# Patient Record
Sex: Female | Born: 1955 | Race: Black or African American | Hispanic: No | Marital: Married | Smoking: Never smoker
Health system: Southern US, Community
[De-identification: ages and names within clinical notes are randomized; demographics above are authoritative.]

## PROBLEM LIST (undated history)

## (undated) DIAGNOSIS — K5120 Ulcerative (chronic) proctitis without complications: Secondary | ICD-10-CM

## (undated) DIAGNOSIS — E7800 Pure hypercholesterolemia, unspecified: Secondary | ICD-10-CM

## (undated) HISTORY — PX: PARTIAL HYSTERECTOMY: SHX80

## (undated) HISTORY — DX: Pure hypercholesterolemia, unspecified: E78.00

## (undated) HISTORY — DX: Ulcerative (chronic) proctitis without complications: K51.20

---

## 2014-06-15 ENCOUNTER — Encounter (INDEPENDENT_AMBULATORY_CARE_PROVIDER_SITE_OTHER): Payer: Self-pay | Admitting: *Deleted

## 2014-07-20 ENCOUNTER — Ambulatory Visit (INDEPENDENT_AMBULATORY_CARE_PROVIDER_SITE_OTHER): Payer: BLUE CROSS/BLUE SHIELD | Admitting: Internal Medicine

## 2014-07-20 ENCOUNTER — Encounter (INDEPENDENT_AMBULATORY_CARE_PROVIDER_SITE_OTHER): Payer: Self-pay | Admitting: Internal Medicine

## 2014-07-20 VITALS — BP 122/62 | HR 80 | Temp 97.5°F | Ht 62.0 in | Wt 138.3 lb

## 2014-07-20 DIAGNOSIS — K5190 Ulcerative colitis, unspecified, without complications: Secondary | ICD-10-CM

## 2014-07-20 LAB — CBC WITH DIFFERENTIAL/PLATELET
Basophils Absolute: 0 10*3/uL (ref 0.0–0.1)
Basophils Relative: 0 % (ref 0–1)
EOS ABS: 0.1 10*3/uL (ref 0.0–0.7)
Eosinophils Relative: 1 % (ref 0–5)
HEMATOCRIT: 39.6 % (ref 36.0–46.0)
HEMOGLOBIN: 13.4 g/dL (ref 12.0–15.0)
LYMPHS PCT: 45 % (ref 12–46)
Lymphs Abs: 2.8 10*3/uL (ref 0.7–4.0)
MCH: 32 pg (ref 26.0–34.0)
MCHC: 33.8 g/dL (ref 30.0–36.0)
MCV: 94.5 fL (ref 78.0–100.0)
MPV: 9.7 fL (ref 8.6–12.4)
Monocytes Absolute: 0.2 10*3/uL (ref 0.1–1.0)
Monocytes Relative: 3 % (ref 3–12)
NEUTROS ABS: 3.2 10*3/uL (ref 1.7–7.7)
Neutrophils Relative %: 51 % (ref 43–77)
Platelets: 184 10*3/uL (ref 150–400)
RBC: 4.19 MIL/uL (ref 3.87–5.11)
RDW: 16.3 % — AB (ref 11.5–15.5)
WBC: 6.2 10*3/uL (ref 4.0–10.5)

## 2014-07-20 LAB — C-REACTIVE PROTEIN: CRP: 0.7 mg/dL — ABNORMAL HIGH (ref <0.60)

## 2014-07-20 MED ORDER — MESALAMINE ER 0.375 G PO CP24: 375.0000 mg | ORAL_CAPSULE | Freq: Every day | ORAL | Status: DC

## 2014-07-20 NOTE — Patient Instructions (Signed)
Am going to get records from Dr. Vangie BickerSpainhour's office. Will start UC mediation once I have those records.

## 2014-07-20 NOTE — Progress Notes (Addendum)
   Subjective:    Patient ID: Sydney Hughes, female    DOB: Sep 21, 1955, 59 y.o.   MRN: [id number]  HPI Referred to our office by [facility], [place], [place]. Hx of Ulcerative proctitis. Diagnosed with ulcerative proctitis in 2007 and apparently lost response to Mesalamine and Canasa. Per records last colonoscopy was in 2011. Per notes, she had good response to Remicade and Entyvio but they caused lupoid like reaction per records. She had joint pain.  She was taking 6MP in October  but she had a drop in her hemoglobin and the medication was stopped. Presently not on any medications for UC since November.  She denies any rectal bleeding. Has not had any rectal bleeding since 2013.NO rectal mucous. There is no abdominal pain. She usually has a BM x 1 a day.  She feels sluggish and tired. Appetite is good. No weight loss.    02/26/2010 Colonoscopy: Finding: Erosive exudative, friable distal and mid rectum and multiple biopsies taken to rule out Crohn's disease or dysplasia.. The priximal rectum sigmoid, descending,transverse, ascending colon and cecum with no abnormalities. Cecm visualized.  Biopsy: Acute and chronic inflammation compatible with inflammatory bowel disease of rectum.  12/27/2010 Flexible Sigmoidoscopy: Continued UC.   05/05/2014 H and H 10.9 and 36.4, MCV 100.5, platelet ct 213. Albumin 3.5, ALP 88, ALT 41, AST 31, Total bili 0.6, direct bili 0.1, indirect 0.5.   Review of Systems Past Medical History  Diagnosis Date  . 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  No Known Allergies  No current outpatient prescriptions on file prior to visit.   No current facility-administered medications on file prior to visit.        Objective:   Physical Exam  Filed Vitals:   07/20/14 1521  Height: 5\' 2"  (1.575 m)  Weight: 138 lb 4.8 oz (62.732 kg)  Alert and oriented. Skin warm and dry. Oral mucosa is moist.   .  Sclera anicteric, conjunctivae is pink. Thyroid not enlarged. No cervical lymphadenopathy. Lungs clear. Heart regular rate and rhythm.  Abdomen is soft. Bowel sounds are positive. No hepatomegaly. No abdominal masses felt. No tenderness.  No edema to lower extremities.           Assessment & Plan:  Presumed UC. I do not have records from [place]. I am going to request these records for a colonoscopy.(Records received 345pm today).  Am going to start her on Apriso 0375g ( 4 tabs daily).  OV in 3 months. CBC and CRP today.

## 2014-08-15 ENCOUNTER — Telehealth (INDEPENDENT_AMBULATORY_CARE_PROVIDER_SITE_OTHER): Payer: Self-pay | Admitting: *Deleted

## 2014-08-15 NOTE — Telephone Encounter (Signed)
Sydney GowerCharlotte started the [place] on 07/21/14 then on 08/12/14 she started having Nausea, sick feeling, rash on her inter thighs, now on hands. The return phone number is [phone].

## 2014-08-15 NOTE — Telephone Encounter (Signed)
Advised her to stop the Apriso. Patient has been intolerant of all medications. Get Benadryl and take for the rash on your thighs, and for itching. OV in May

## 2014-10-18 ENCOUNTER — Encounter (INDEPENDENT_AMBULATORY_CARE_PROVIDER_SITE_OTHER): Payer: Self-pay | Admitting: Internal Medicine

## 2014-10-18 ENCOUNTER — Ambulatory Visit (INDEPENDENT_AMBULATORY_CARE_PROVIDER_SITE_OTHER): Payer: BLUE CROSS/BLUE SHIELD | Admitting: Internal Medicine

## 2014-10-18 VITALS — BP 102/70 | HR 76 | Temp 98.2°F | Ht 62.0 in | Wt 136.2 lb

## 2014-10-18 DIAGNOSIS — K5120 Ulcerative (chronic) proctitis without complications: Secondary | ICD-10-CM

## 2014-10-18 NOTE — Progress Notes (Addendum)
   Subjective:    Patient ID: Sydney PearsonCharlotte Hughes, female    DOB: [birth year], 59 y.o.   MRN: [id number]  HPI Here today for f/u of her UC.  Last seen in February as initial visit. Patient of Dr. Aleene DavidsonSpainhour.  Diagnosed with ulcerative proctitis in 2007 and apparently lost response to Mesalamine and Canasa.  R  She had good response to Remicade and Entyvio she thinks in October of 2015 but they caused lupoid like reaction per records. She had joint pain with stiffness She was taking 6MP in October which did not help. She has taking Canasa which did not stop the bleeding. She has tried Uceris which did not help. Asacol did not help.   Per records last colonoscopy was in 2011. She was started on Apriso. her lat OV but developed a rash and the medication was stopped.  She tells me she is doing okay. She is having 3 BMs a day and sometimes she will skip a day. She has not seen any blood in her stools. No mucous. Appetite is good. She has lost about 1 1/12 pounds since her visit in February.  Has used Hydrocortisone enemas during a flare. No recent rashes. No joint pain now.   CBC    Component Value Date/Time   WBC 6.2 07/20/2014 1544   RBC 4.19 07/20/2014 1544   HGB 13.4 07/20/2014 1544   HCT 39.6 07/20/2014 1544   PLT 184 07/20/2014 1544   MCV 94.5 07/20/2014 1544   MCH 32.0 07/20/2014 1544   MCHC 33.8 07/20/2014 1544   RDW 16.3* 07/20/2014 1544   LYMPHSABS 2.8 07/20/2014 1544   MONOABS 0.2 07/20/2014 1544   EOSABS 0.1 07/20/2014 1544   BASOSABS 0.0 07/20/2014 1544   07/20/2014 sedrate 0.7    02/26/2010 Colonoscopy: Finding: Erosive exudative, friable distal and mid rectum and multiple biopsies taken to rule out Crohn's disease or dysplasia.. The priximal rectum sigmoid, descending,transverse, ascending colon and cecum with no abnormalities. Cecm visualized. Biopsy: Acute and chronic inflammation compatible with inflammatory bowel disease of rectum.  12/27/2010 Flexible Sigmoidoscopy:  Continued UC.   Review of Systems Past Medical History  Diagnosis Date  . 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  No Known Allergies  Current Outpatient Prescriptions  Medication Sig Dispense Refill  . Multiple Vitamin (MULTIVITAMIN) tablet Take 1 tablet by mouth daily.     No current facility-administered medications for this visit.         Objective:   Physical Exam Blood pressure 102/70, pulse 76, temperature 98.2 F (36.8 C), height 5\' 2"  (1.575 m), weight 136 lb 3.2 oz (61.78 kg).  Alert and oriented. Skin warm and dry. Oral mucosa is moist.   . Sclera anicteric, conjunctivae is pink. Thyroid not enlarged. No cervical lymphadenopathy. Lungs clear. Heart regular rate and rhythm.  Abdomen is soft. Bowel sounds are positive. No hepatomegaly. No abdominal masses felt. No tenderness.  No edema to lower extremities.         Assessment & Plan:  UC. Presently not taking any medication.  Patient needs PPD. May consider Humira OV in 3 months CBC and sedrate today, Hep C antibody, Hep B surface antigen.

## 2014-10-18 NOTE — Patient Instructions (Signed)
Labs today.  Need PPD test.  OV 3 months.

## 2014-10-19 LAB — CBC WITH DIFFERENTIAL/PLATELET
BASOS ABS: 0 10*3/uL (ref 0.0–0.1)
Basophils Relative: 0 % (ref 0–1)
EOS ABS: 0.1 10*3/uL (ref 0.0–0.7)
EOS PCT: 1 % (ref 0–5)
HEMATOCRIT: 37.5 % (ref 36.0–46.0)
Hemoglobin: 12.3 g/dL (ref 12.0–15.0)
LYMPHS PCT: 57 % — AB (ref 12–46)
Lymphs Abs: 2.9 10*3/uL (ref 0.7–4.0)
MCH: 31.1 pg (ref 26.0–34.0)
MCHC: 32.8 g/dL (ref 30.0–36.0)
MCV: 94.7 fL (ref 78.0–100.0)
MONO ABS: 0.2 10*3/uL (ref 0.1–1.0)
MPV: 9.5 fL (ref 8.6–12.4)
Monocytes Relative: 4 % (ref 3–12)
Neutro Abs: 1.9 10*3/uL (ref 1.7–7.7)
Neutrophils Relative %: 38 % — ABNORMAL LOW (ref 43–77)
PLATELETS: 186 10*3/uL (ref 150–400)
RBC: 3.96 MIL/uL (ref 3.87–5.11)
RDW: 13.8 % (ref 11.5–15.5)
WBC: 5 10*3/uL (ref 4.0–10.5)

## 2014-10-19 LAB — C-REACTIVE PROTEIN: CRP: 0.5 mg/dL (ref <0.60)

## 2014-10-19 LAB — HEPATITIS B SURFACE ANTIGEN: HEP B S AG: NEGATIVE

## 2014-10-20 ENCOUNTER — Encounter (INDEPENDENT_AMBULATORY_CARE_PROVIDER_SITE_OTHER): Payer: Self-pay

## 2014-10-25 LAB — HEPATITIS C RNA QUANTITATIVE: HCV Quantitative: NOT DETECTED IU/mL (ref <15)

## 2014-10-26 ENCOUNTER — Telehealth (INDEPENDENT_AMBULATORY_CARE_PROVIDER_SITE_OTHER): Payer: Self-pay | Admitting: Internal Medicine

## 2014-10-26 DIAGNOSIS — K5120 Ulcerative (chronic) proctitis without complications: Secondary | ICD-10-CM

## 2014-10-26 LAB — HEPATITIS C GENOTYPE

## 2014-10-26 NOTE — Telephone Encounter (Signed)
I did not order the Hep C antibody. Will order.

## 2014-10-26 NOTE — Telephone Encounter (Signed)
10/18/2014 PPD is negative. Hard copy was received.

## 2014-10-28 LAB — HEPATITIS C ANTIBODY: HCV AB: NEGATIVE

## 2014-11-11 ENCOUNTER — Telehealth (INDEPENDENT_AMBULATORY_CARE_PROVIDER_SITE_OTHER): Payer: Self-pay | Admitting: Internal Medicine

## 2014-11-11 NOTE — Telephone Encounter (Signed)
I spoke with  Dr. Karilyn Cotaehman. Will not start any medication now. She has OV in August and will get a CBC and CRP. She seems to be in remission.

## 2015-01-16 ENCOUNTER — Telehealth (INDEPENDENT_AMBULATORY_CARE_PROVIDER_SITE_OTHER): Payer: Self-pay | Admitting: *Deleted

## 2015-01-16 NOTE — Telephone Encounter (Signed)
Ok finally can understand  She said someone called her Friday from here   Please call her back  It appears she has an appt this week  [phone]

## 2015-01-16 NOTE — Telephone Encounter (Signed)
Returned patient's call and advised her she had an apt for 01/18/15 at 2:00 pm. Voices understood.

## 2015-01-18 ENCOUNTER — Telehealth (INDEPENDENT_AMBULATORY_CARE_PROVIDER_SITE_OTHER): Payer: Self-pay | Admitting: Internal Medicine

## 2015-01-18 ENCOUNTER — Ambulatory Visit (INDEPENDENT_AMBULATORY_CARE_PROVIDER_SITE_OTHER): Payer: BLUE CROSS/BLUE SHIELD | Admitting: Internal Medicine

## 2015-01-18 ENCOUNTER — Encounter (INDEPENDENT_AMBULATORY_CARE_PROVIDER_SITE_OTHER): Payer: Self-pay

## 2015-01-18 ENCOUNTER — Encounter (INDEPENDENT_AMBULATORY_CARE_PROVIDER_SITE_OTHER): Payer: Self-pay | Admitting: Internal Medicine

## 2015-01-18 VITALS — BP 100/50 | HR 76 | Temp 98.0°F | Ht 62.0 in | Wt 138.7 lb

## 2015-01-18 DIAGNOSIS — K5120 Ulcerative (chronic) proctitis without complications: Secondary | ICD-10-CM

## 2015-01-18 DIAGNOSIS — E7800 Pure hypercholesterolemia, unspecified: Secondary | ICD-10-CM | POA: Insufficient documentation

## 2015-01-18 NOTE — Telephone Encounter (Signed)
Dr. Karilyn Cota. This is the chart you said u would look at. She is presently not taking any medication for her UC. She has been intolerant of most of UC medication ? May need colonoscopy.

## 2015-01-18 NOTE — Patient Instructions (Signed)
I am going to discuss with Dr. Karilyn Cota. Possible colonoscopy. CBC and CRP today

## 2015-01-18 NOTE — Progress Notes (Addendum)
Subjective:    Patient ID: Sydney Hughes, female    DOB: [birth year], 59 y.o.   MRN: [id number]  HPI Cindra is here today for f/u of her UC. She was last seen in May for f/u. Previously seen by Dr. Aleene Davidson. She was diagnosed with Ulcerative proctitis in 2007 and lost response to Mesalamine and Canasa.  From records she had good response to Remicade and Entyvio she thinks in October of 2015 but these drugs caused lupoid like reaction. She had joint pain with stiffness. She has taken which did not help. She has also tried Uceris but did not help nor did Asacol. Her last colonoscopy was in 2011. I started her on Apriso in February but she developed a rash and the medication was stopped.  She has a negative PPD 10/20/2014 10/18/2014 Hep C antibody negative, Hep B surface antigen negative. She tells me she is doing good. She has gained 2 pounds. She tells me she is having a BM every 2 days and sometimes every 3 days. She has not seen any BBRB. Sometimes she feels a pin like sensation after a BM but she says this is not a pain.  No mucous. Her appetite has remained good. She says she has been having hot flashes from her neck up.  No SOB.  02/26/2010 Colonoscopy: Finding: Erosive exudative, friable distal and mid rectum and multiple biopsies taken to rule out Crohn's disease or dysplasia.. The priximal rectum sigmoid, descending,transverse, ascending colon and cecum with no abnormalities. Cecum visualized. Biopsy: Acute and chronic inflammation compatible with inflammatory bowel disease of rectum.  12/27/2010 Flexible Sigmoidoscopy: Continued UC. 2007 Rectal biopsy: Acute and chronic inflammation compatible with Inflammatory Bowel Disease of rectum.  CBC    Component Value Date/Time   WBC 5.0 10/18/2014 1427   RBC 3.96 10/18/2014 1427   HGB 12.3 10/18/2014 1427   HCT 37.5 10/18/2014 1427   PLT 186 10/18/2014 1427   MCV 94.7 10/18/2014 1427   MCH 31.1 10/18/2014 1427   MCHC 32.8  10/18/2014 1427   RDW 13.8 10/18/2014 1427   LYMPHSABS 2.9 10/18/2014 1427   MONOABS 0.2 10/18/2014 1427   EOSABS 0.1 10/18/2014 1427   BASOSABS 0.0 10/18/2014 1427   10/17/2013 CRP 0.5   Review of Systems Past Medical History  Diagnosis Date  . 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 High cholesterol     Past Surgical History  Procedure Laterality Date  . Partial hysterectomy      fibroids    Allergies  Allergen Reactions  . Penicillins     swelling    Current Outpatient Prescriptions on File Prior to Visit  Medication Sig Dispense Refill  . Multiple Vitamin (MULTIVITAMIN) tablet Take 1 tablet by mouth daily.     No current facility-administered medications on file prior to visit.        Objective:   Physical Exam Blood pressure 100/50, pulse 76, temperature 98 F (36.7 C), height  (1.575 m), weight 138 lb 11.2 oz (62.914 kg). Alert and oriented. Skin warm and dry. Oral mucosa is moist.   . Sclera anicteric, conjunctivae is pink. Thyroid not enlarged. No cervical lymphadenopathy. Lungs clear. Heart regular rate and rhythm.  Abdomen is soft. Bowel sounds are positive. No hepatomegaly. No abdominal masses felt. No tenderness.  No edema to lower extremities.  Stool brown and guaiac negative. Slight discomfort on rectal exam.         Assessment & Plan:  UC. Presently without  any symptoms. I am going to have Dr. Karilyn Cota look over this chart. CBC and CRP today He may want to do a colonoscopy for surveillance to see if she is active.

## 2015-01-19 LAB — CBC WITH DIFFERENTIAL/PLATELET
BASOS ABS: 0 10*3/uL (ref 0.0–0.1)
BASOS PCT: 0 % (ref 0–1)
EOS PCT: 1 % (ref 0–5)
Eosinophils Absolute: 0.1 10*3/uL (ref 0.0–0.7)
HEMATOCRIT: 37.3 % (ref 36.0–46.0)
Hemoglobin: 12.4 g/dL (ref 12.0–15.0)
LYMPHS ABS: 3.2 10*3/uL (ref 0.7–4.0)
Lymphocytes Relative: 58 % — ABNORMAL HIGH (ref 12–46)
MCH: 30.8 pg (ref 26.0–34.0)
MCHC: 33.2 g/dL (ref 30.0–36.0)
MCV: 92.8 fL (ref 78.0–100.0)
MONO ABS: 0.2 10*3/uL (ref 0.1–1.0)
MONOS PCT: 3 % (ref 3–12)
MPV: 9.4 fL (ref 8.6–12.4)
Neutro Abs: 2.1 10*3/uL (ref 1.7–7.7)
Neutrophils Relative %: 38 % — ABNORMAL LOW (ref 43–77)
Platelets: 185 10*3/uL (ref 150–400)
RBC: 4.02 MIL/uL (ref 3.87–5.11)
RDW: 14.7 % (ref 11.5–15.5)
WBC: 5.5 10*3/uL (ref 4.0–10.5)

## 2015-01-19 LAB — C-REACTIVE PROTEIN: CRP: 0.6 mg/dL — ABNORMAL HIGH (ref <0.60)

## 2015-01-19 NOTE — Telephone Encounter (Signed)
Patient can try VLS#3 3 g per day. Disease duration 9 years and if she remains in remission hour delay colonoscopy for at least one more year.

## 2015-01-23 NOTE — Telephone Encounter (Signed)
I advised her of the Probiotic. She will call me back and let me know when she gets it . She has an OV in November

## 2015-01-26 ENCOUNTER — Telehealth (INDEPENDENT_AMBULATORY_CARE_PROVIDER_SITE_OTHER): Payer: Self-pay | Admitting: *Deleted

## 2015-01-26 NOTE — Telephone Encounter (Signed)
Called [facility]  She is having problems finding probiotic you asked  Her to get  Call her please

## 2015-01-27 NOTE — Telephone Encounter (Signed)
I spoke with husband and told him she could order medication from [email or web address]

## 2015-01-27 NOTE — Telephone Encounter (Signed)
No answer at home. Patient can order this medication from [email or web address]

## 2015-04-03 ENCOUNTER — Telehealth (INDEPENDENT_AMBULATORY_CARE_PROVIDER_SITE_OTHER): Payer: Self-pay | Admitting: *Deleted

## 2015-04-03 ENCOUNTER — Other Ambulatory Visit (INDEPENDENT_AMBULATORY_CARE_PROVIDER_SITE_OTHER): Payer: Self-pay | Admitting: Internal Medicine

## 2015-04-03 DIAGNOSIS — K5120 Ulcerative (chronic) proctitis without complications: Secondary | ICD-10-CM

## 2015-04-03 MED ORDER — MESALAMINE 1000 MG RE SUPP: 1000.0000 mg | Freq: Every day | RECTAL | Status: DC

## 2015-04-03 NOTE — Telephone Encounter (Signed)
Sydney GowerCharlotte seen one time a smear of blood. Would like to know if she could get a Rx for Canasa. Her return phone number is [phone]

## 2015-04-03 NOTE — Telephone Encounter (Signed)
She saw x 1 a spot of blood when she wiped. I am going to give her a refill on the Canasa. She has OV in November.

## 2015-04-19 ENCOUNTER — Encounter (INDEPENDENT_AMBULATORY_CARE_PROVIDER_SITE_OTHER): Payer: Self-pay | Admitting: Internal Medicine

## 2015-04-19 ENCOUNTER — Ambulatory Visit (INDEPENDENT_AMBULATORY_CARE_PROVIDER_SITE_OTHER): Payer: BLUE CROSS/BLUE SHIELD | Admitting: Internal Medicine

## 2015-04-19 VITALS — BP 134/62 | HR 80 | Temp 98.5°F | Ht 62.0 in | Wt 139.9 lb

## 2015-04-19 DIAGNOSIS — K51218 Ulcerative (chronic) proctitis with other complication: Secondary | ICD-10-CM

## 2015-04-19 NOTE — Progress Notes (Addendum)
Subjective:    Patient ID: Sydney Hughes, female    DOB: 06-05-1956, 59 y.o.   MRN: [id number]  HPI Here today for f/u. She was last seen in August of this year. Hx of UC.  Previously seen by Dr. Aleene Davidson. Diagosedwith UC in 2007 and lost repsonse to Mesalamine and Canasa.  From medial records she had a good response to Remicade and Entyvio in 2015 but these drugs caused lupoid like reaction. She had joint pain and stiffness. She has taken , Urceris and Asacol in the past which did not help. I started her on Apriso in February of this year and she developed a rash and the medication was discontinued. Her last colonoscopy was in 2011 (see below).  She is presently taking a Probiotic. She tells me she is doing good. She has gained 2 pounds since her last visit. She is having a BM every 2 days and sometimes every 3 days. She rarely see a "smear of blood".  There is no abdominal pain. No melena.  Presently using Canasa supp every night.  01/2015 Sedrate 0.6 CBC    Component Value Date/Time   WBC 5.5 01/18/2015 1433   RBC 4.02 01/18/2015 1433   HGB 12.4 01/18/2015 1433   HCT 37.3 01/18/2015 1433   PLT 185 01/18/2015 1433   MCV 92.8 01/18/2015 1433   MCH 30.8 01/18/2015 1433   MCHC 33.2 01/18/2015 1433   RDW 14.7 01/18/2015 1433   LYMPHSABS 3.2 01/18/2015 1433   MONOABS 0.2 01/18/2015 1433   EOSABS 0.1 01/18/2015 1433   BASOSABS 0.0 01/18/2015 1433    HPI She has a negative PPD 10/20/2014 10/18/2014 Hep C antibody negative, Hep B surface antigen negative.   02/26/2010 Colonoscopy: Finding: Erosive exudative, friable distal and mid rectum and multiple biopsies taken to rule out Crohn's disease or dysplasia.. The priximal rectum sigmoid, descending,transverse, ascending colon and cecum with no abnormalities. Cecum visualized. Biopsy: Acute and chronic inflammation compatible with inflammatory bowel disease of rectum.  12/27/2010 Flexible Sigmoidoscopy: Continued UC. 2007 Rectal  biopsy: Acute and chronic inflammation compatible with Inflammatory bowel disease of rectum.   Review of Systems Past Medical History  Diagnosis Date  . 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 High cholesterol     Past Surgical History  Procedure Laterality Date  . Partial hysterectomy      fibroids    Allergies  Allergen Reactions  . Penicillins     swelling    Current Outpatient Prescriptions on File Prior to Visit  Medication Sig Dispense Refill  . Fish Oil-Cholecalciferol (FISH OIL + D3 PO) Take 1,000 mg by mouth daily.    . mesalamine (CANASA) 1000 MG suppository Place 1 suppository (1,000 mg total) rectally at bedtime. (Patient taking differently: Place 1,000 mg rectally as needed. ) 30 suppository 1  . Multiple Vitamin (MULTIVITAMIN) tablet Take 1 tablet by mouth daily.     No current facility-administered medications on file prior to visit.        Objective:   Physical Exam Blood pressure 134/62, pulse 80, temperature 98.5 F (36.9 C), height  (1.575 m), weight 139 lb 14.4 oz (63.458 kg). Alert and oriented. Skin warm and dry. Oral mucosa is moist.   . Sclera anicteric, conjunctivae is pink. Thyroid not enlarged. No cervical lymphadenopathy. Lungs clear. Heart regular rate and rhythm.  Abdomen is soft. Bowel sounds are positive. No hepatomegaly. No abdominal masses felt. No tenderness.  No edema to lower extremities.  Assessment & Plan:  UC. Presently intolerant of most all UC medications. Presently taking a Probiotic and Canasa as needed. OV in 6 months with Dr Karilyn Cotaehman. Will get labs at OV since she lives in [place]. Surveillance colonoscopy in August of 2017.

## 2015-04-19 NOTE — Patient Instructions (Signed)
Continue the Canasa. OV in 6 months with Dr Karilyn Cotaehman

## 2015-06-27 ENCOUNTER — Other Ambulatory Visit (INDEPENDENT_AMBULATORY_CARE_PROVIDER_SITE_OTHER): Payer: Self-pay | Admitting: Internal Medicine

## 2015-06-29 NOTE — Telephone Encounter (Signed)
Ms. Sydney Hughes called to see if the Rx for Sydney KillingsCanasa has been sent to her pharmacy. I didn't see where the Rx has been sent but I did see where we received the request from [facility]. Please call the pt if needed.  Pt's home ph# [phone] Thank you.

## 2015-09-16 ENCOUNTER — Other Ambulatory Visit (INDEPENDENT_AMBULATORY_CARE_PROVIDER_SITE_OTHER): Payer: Self-pay | Admitting: Internal Medicine

## 2015-10-31 ENCOUNTER — Encounter (INDEPENDENT_AMBULATORY_CARE_PROVIDER_SITE_OTHER): Payer: Self-pay | Admitting: Internal Medicine

## 2015-10-31 ENCOUNTER — Ambulatory Visit (INDEPENDENT_AMBULATORY_CARE_PROVIDER_SITE_OTHER): Payer: BLUE CROSS/BLUE SHIELD | Admitting: Internal Medicine

## 2015-10-31 VITALS — BP 110/76 | HR 80 | Temp 97.7°F | Resp 18 | Ht 62.0 in | Wt 140.5 lb

## 2015-10-31 DIAGNOSIS — K51218 Ulcerative (chronic) proctitis with other complication: Secondary | ICD-10-CM

## 2015-10-31 DIAGNOSIS — R938 Abnormal findings on diagnostic imaging of other specified body structures: Secondary | ICD-10-CM | POA: Diagnosis not present

## 2015-10-31 DIAGNOSIS — R9389 Abnormal findings on diagnostic imaging of other specified body structures: Secondary | ICD-10-CM

## 2015-10-31 NOTE — Progress Notes (Signed)
Presenting complaint;  Abnormality to pancreatic tail on CT. Follow-up for ulcerative proctitis.  Subjective:  Patient is 60 year old African-American female who has over 10 year history of ulcerative proctitis and has been under our care since May 2016. She previously was under care of Dr. Samuella CotaPandya and more recently Dr. Sammie BenchJack Spanhour. She was last seen on 04/19/2015 by Ms. Dorene Arerri Setzer NP and was doing well. She recalls that she developed right flank and lower quadrant pain in January this year. She believes pain was musculoskeletal related to her work. She did not experience associated symptoms such as nausea vomiting fever hematuria diarrhea or rectal bleeding. She underwent abdominopelvic CT on 06/30/2015 and this study revealed light thickening and indistinctness to peripancreatic fat involving the tail region. This finding was felt to be nonspecific but may also represent mild focal pancreatitis. She also has had mildly elevated serum lipase in the past. Serum lipase was 145 back in 05/06/2015 but I do not have more recent values. Patient denies history of pancreatitis. She also denies upper abdominal or back pain. She has nausea once or twice a month usually after evening meal she has never vomited. She has good appetite and her weight has been stable. She does not drink alcohol. She states as long as she uses Canasa suppositories she does not experience rectal bleeding. She has tried to use it every other day but it does not work. She is not having any side effects with this medication. He states she has tried multiple medications in the past including Remicade is to call mercaptopurine Uceris and Entyvio. She had muscle and joint pains with Remicade and other medications did not work.  Family history significant for pancreatic carcinoma in a brother. He was 50 at the time of diagnosis and died 2 years later. He worked in [employer]. He never had pancreatitis before. Father developed mesothelioma  in his 3180s and had bile duct obstruction requiring stenting. She is not sure if he also had gallbladder cancer. Mother is 1592 and doing reasonably well.   Current Medications: Outpatient Encounter Prescriptions as of 10/31/2015  Medication Sig  . CANASA 1000 MG suppository PLACE 1 SUPPOSITORY (1,000 MG TOTAL) RECTALLY AT BEDTIME.  . Lactobacillus (PROBIOTIC ACIDOPHILUS PO) Take 500 mg by mouth daily.   . rosuvastatin (CRESTOR) 10 MG tablet Take 10 mg by mouth daily.  . [DISCONTINUED] Fish Oil-Cholecalciferol (FISH OIL + D3 PO) Take 1,000 mg by mouth daily. Reported on 10/31/2015  . [DISCONTINUED] Multiple Vitamin (MULTIVITAMIN) tablet Take 1 tablet by mouth daily. Reported on 10/31/2015   No facility-administered encounter medications on file as of 10/31/2015.     Objective: Blood pressure 110/76, pulse 80, temperature 97.7 F (36.5 C), temperature source Oral, resp. rate 18, height 5\' 2"  (1.575 m), weight 140 lb 8 oz (63.73 kg). Patient is alert and in no acute distress. Conjunctiva is pink. Sclera is nonicteric Oropharyngeal mucosa is normal. No neck masses or thyromegaly noted. Cardiac exam with regular rhythm normal S1 and S2. No murmur or gallop noted. Lungs are clear to auscultation. Abdomen is symmetrical. Bowel sounds are normal. On palpation abdomen is soft and nontender without organomegaly or masses.  No LE edema or clubbing noted.  Labs/studies Results:   CT results noted. This study was performed on 06/30/2015.  Lab data from 04/26/2015 Serum amylase 145(mildly elevated) Bilirubin less than 0.2 AP 126 AST 28 ALT 18.   Assessment:  #1. Vague abnormality involving pancreatic tail. Based on patient's history she has never experienced  pancreatitis. She does not have risk factors for pancreatitis. Only factor Worth mentioning is that her brother died of pancreatic cancer at age 80. Whether or not she needs a follow-up study will be determined after CT films  reviewed. #2. Ulcerative proctitis. She appears to be remission while on daily dose of Canasa. She should continue his therapy as long as it is working. Last colonoscopy was in September, 2011. She should consider surveillance exam within the next 2 years.   Plan:  Will request copy of blood work from January, 2017. We'll also request CT films for review and determine if patient needs a follow-up CT or MRCP. Office visit in 6 months.

## 2015-10-31 NOTE — Patient Instructions (Signed)
Will review blood work from January, 2017 and CT films for further recommendations made. Continue using mesalamine or Canasa suppositories as discussed.

## 2015-11-17 ENCOUNTER — Encounter (INDEPENDENT_AMBULATORY_CARE_PROVIDER_SITE_OTHER): Payer: Self-pay

## 2015-12-18 ENCOUNTER — Other Ambulatory Visit (INDEPENDENT_AMBULATORY_CARE_PROVIDER_SITE_OTHER): Payer: Self-pay | Admitting: Internal Medicine

## 2016-01-17 ENCOUNTER — Encounter (INDEPENDENT_AMBULATORY_CARE_PROVIDER_SITE_OTHER): Payer: Self-pay | Admitting: Internal Medicine

## 2016-02-01 ENCOUNTER — Encounter (INDEPENDENT_AMBULATORY_CARE_PROVIDER_SITE_OTHER): Payer: Self-pay

## 2016-02-13 ENCOUNTER — Encounter (INDEPENDENT_AMBULATORY_CARE_PROVIDER_SITE_OTHER): Payer: Self-pay | Admitting: Internal Medicine

## 2016-02-13 ENCOUNTER — Ambulatory Visit (INDEPENDENT_AMBULATORY_CARE_PROVIDER_SITE_OTHER): Payer: BLUE CROSS/BLUE SHIELD | Admitting: Internal Medicine

## 2016-02-13 VITALS — BP 112/74 | HR 78 | Temp 97.8°F | Resp 18 | Ht 62.0 in | Wt 140.1 lb

## 2016-02-13 DIAGNOSIS — K51218 Ulcerative (chronic) proctitis with other complication: Secondary | ICD-10-CM

## 2016-02-13 DIAGNOSIS — R938 Abnormal findings on diagnostic imaging of other specified body structures: Secondary | ICD-10-CM | POA: Diagnosis not present

## 2016-02-13 DIAGNOSIS — R9389 Abnormal findings on diagnostic imaging of other specified body structures: Secondary | ICD-10-CM

## 2016-02-13 NOTE — Progress Notes (Signed)
Presenting complaint;  Follow-up regarding pancreatic CT abnormality history of ulcerative proctitis..  Database and Subjective:  Patient is 60 year old African-American female who is here for scheduled visit. She was last seen in May 2017.   he had abdominopelvic CT with contrast in January 2017 for flank pain and mildly elevated serum amylase. It showed expanded tail of pancreas with indistinct margins. It was decided to review her CT before any decisions made. CT films [facility]. She denies abdominal pain. She has never been diagnosed with pancreatitis. She has good appetite and her weight has been stable. Since she has been using Canasa suppositories daily she has not experienced rectal bleeding. She states she took an antibiotic for cold and since then her stools have been soft and previously he used to be hard. She does not drink alcohol. Family history significant for pancreatic carcinoma and her brother who is age 62 at the time of diagnosis and died less than 2 years. 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    Current Medications: Outpatient Encounter Prescriptions as of 02/13/2016  Medication Sig  . CANASA 1000 MG suppository PLACE 1 SUPPOSITORY (1,000 MG TOTAL) RECTALLY AT BEDTIME.  . fluticasone (FLONASE) 50 MCG/ACT nasal spray Place 1 spray into both nostrils daily.  . Lactobacillus (PROBIOTIC ACIDOPHILUS PO) Take 500 mg by mouth daily.   Marland Kitchen. loratadine (CLARITIN) 10 MG tablet Take 10 mg by mouth daily.  . Multiple Vitamins-Minerals (CENTRUM PO) Take by mouth daily. This is liquid form.  . rosuvastatin (CRESTOR) 10 MG tablet Take 10 mg by mouth daily.   No facility-administered encounter medications on file as of 02/13/2016.      Objective: Blood pressure 112/74, pulse 78, temperature 97.8 F (36.6 C), temperature source Oral, resp. rate 18, height 5\' 2"  (1.575 m), weight 140 lb 1.6 oz (63.5  kg). Patient is alert and in no acute distress.va is pink. Sclera is nonicteric Oropharyngeal mucosa is normal. No neck masses or thyromegaly noted. Cardiac exam with regular rhythm normal S1 and S2. No murmur or gallop noted. Lungs are clear to auscultation. Abdomen is symmetrical soft and nontender without organomegaly or masses.  No LE edema or clubbing noted.  Labs/studies Results: Lab data from 11/22/2015  Bilirubin 0.3, AP 124(39 -117 ). AST 31, ALT 28, total protein 6.8 and albumin 3.8.  Serum calcium 8.9.  BUN 10 and creatinine 0.71  Glucose 84.     triglyceride 152.  Assessment:  #1. prominence to pancreatic tail with hazy margins on CT of January 2017 code indicated mild episode of pancreatitis or nonspecific finding. She does not have risk factors for pancreatitis. Family history is very significant for pancreatic carcinoma in her brother who was [id number] with the time of diagnosis.  Therefore pancreas needs to be further evaluated with MRCP. #2. History of ulcerative proctitis. Disease duration 10 years. Last colonoscopy was in September 2011. She would like to wait another year before colonoscopy.    Plan:  MRCP. She will try decreasing Canasa dose to 6 times a week and thereafter will use for 5 days each week. Eventually she may be able to control her disease by using Canasa suppository 3 times a week. Office visit in one year.

## 2016-02-13 NOTE — Patient Instructions (Signed)
Will schedule MRCP. Can decrease Canasa suppository dose to 6 per week and if symptoms do not relapse can drop to 5 doses per week.

## 2016-02-15 ENCOUNTER — Other Ambulatory Visit (INDEPENDENT_AMBULATORY_CARE_PROVIDER_SITE_OTHER): Payer: Self-pay | Admitting: Internal Medicine

## 2016-02-15 DIAGNOSIS — R932 Abnormal findings on diagnostic imaging of liver and biliary tract: Secondary | ICD-10-CM

## 2016-02-26 ENCOUNTER — Ambulatory Visit (HOSPITAL_COMMUNITY)
Admission: RE | Admit: 2016-02-26 | Discharge: 2016-02-26 | Disposition: A | Discharge status: Home / Self Care | Payer: BLUE CROSS/BLUE SHIELD | Source: Ambulatory Visit | Attending: Internal Medicine | Admitting: Internal Medicine

## 2016-02-26 ENCOUNTER — Other Ambulatory Visit (INDEPENDENT_AMBULATORY_CARE_PROVIDER_SITE_OTHER): Payer: Self-pay | Admitting: Internal Medicine

## 2016-02-26 DIAGNOSIS — R932 Abnormal findings on diagnostic imaging of liver and biliary tract: Secondary | ICD-10-CM

## 2016-02-26 LAB — POCT I-STAT CREATININE: Creatinine, Ser: 0.7 mg/dL (ref 0.44–1.00)

## 2016-02-26 MED ORDER — GADOBENATE DIMEGLUMINE 529 MG/ML IV SOLN
13.0000 mL | Freq: Once | INTRAVENOUS | Status: AC | PRN
  Administered 2016-02-26: 13 mL via INTRAVENOUS

## 2016-02-26 MED ORDER — SODIUM CHLORIDE 0.9 % IV SOLN
INTRAVENOUS | Status: AC
  Filled 2016-02-26: qty 500

## 2016-02-27 ENCOUNTER — Other Ambulatory Visit (INDEPENDENT_AMBULATORY_CARE_PROVIDER_SITE_OTHER): Payer: Self-pay | Admitting: Internal Medicine

## 2016-02-28 ENCOUNTER — Other Ambulatory Visit (INDEPENDENT_AMBULATORY_CARE_PROVIDER_SITE_OTHER): Payer: Self-pay | Admitting: *Deleted

## 2016-02-28 DIAGNOSIS — R109 Unspecified abdominal pain: Secondary | ICD-10-CM

## 2016-02-28 DIAGNOSIS — K51918 Ulcerative colitis, unspecified with other complication: Secondary | ICD-10-CM

## 2016-02-29 LAB — HEPATIC FUNCTION PANEL
ALBUMIN: 3.7 g/dL (ref 3.6–5.1)
ALK PHOS: 95 U/L (ref 33–130)
ALT: 19 U/L (ref 6–29)
AST: 27 U/L (ref 10–35)
BILIRUBIN INDIRECT: 0.2 mg/dL (ref 0.2–1.2)
Bilirubin, Direct: 0.1 mg/dL (ref <=0.2)
TOTAL PROTEIN: 6.5 g/dL (ref 6.1–8.1)
Total Bilirubin: 0.3 mg/dL (ref 0.2–1.2)

## 2016-02-29 LAB — LIPASE: LIPASE: 129 U/L — AB (ref 7–60)

## 2016-02-29 LAB — AMYLASE: AMYLASE: 120 U/L — AB (ref 0–105)

## 2016-03-06 ENCOUNTER — Ambulatory Visit (INDEPENDENT_AMBULATORY_CARE_PROVIDER_SITE_OTHER): Payer: BLUE CROSS/BLUE SHIELD | Admitting: Internal Medicine

## 2016-03-06 ENCOUNTER — Encounter (INDEPENDENT_AMBULATORY_CARE_PROVIDER_SITE_OTHER): Payer: Self-pay | Admitting: Internal Medicine

## 2016-03-06 VITALS — BP 90/54 | HR 64 | Temp 98.2°F | Ht 62.0 in | Wt 134.8 lb

## 2016-03-06 DIAGNOSIS — R1013 Epigastric pain: Secondary | ICD-10-CM

## 2016-03-06 DIAGNOSIS — G8929 Other chronic pain: Secondary | ICD-10-CM

## 2016-03-06 NOTE — Patient Instructions (Signed)
Cbc, cmet, amylase. GI pathogen

## 2016-03-06 NOTE — Progress Notes (Signed)
Subjective:    Patient ID: Sydney Hughes, female    DOB: 01/22/1956, 60 y.o.   MRN: [id number]  HPI Here today for f/u of her UC. She was last seen in August of this year by Dr .Karilyn Cotaehman.  He last colonoscopy was in 2011 which revealed erosive exudative,friable distal and mid rectum and multiple biopsies take to rule out Crohn's disease or dysplasia.  Biopsy: Acute and chronic inflammation compatible with inflammatory bowel disease of rectum.  Presently taking Canasa Supp for her UC. Has been intolerant of other UC drugs.  She tells me she has had abdominal pain since 12/24/2015.  She had an MRI 02/26/2016. MRI was normal. CT scan in January of this year which revealed slight thickening and indistinctness to the peripancreatic fat involving the tail region. Non specific finding but may represent mild focal pancreatitis. Mild prominence to stool within the colon with ectasia. Otherwise negative CT.    She was having 3 formed stools a days.  She tells me Friday she started having diarrhea.  She had nausea Sunday night. No vomiting. Diarrhea resolved Monday.  No recent antibiotics. No prior hx of pancreatitis. Family history significant for pancreatic carcinoma in her brother age 62 at the time of diagnosis and died less than 2 years. She states she does not have abdominal pain until she eats.  CMP     Component Value Date/Time   CREATININE 0.70 02/26/2016 1215   PROT 6.5 02/28/2016 1644   ALBUMIN 3.7 02/28/2016 1644   AST 27 02/28/2016 1644   ALT 19 02/28/2016 1644   ALKPHOS 95 02/28/2016 1644   BILITOT 0.3 02/28/2016 1644    CBC    Component Value Date/Time   WBC 5.5 01/18/2015 1433   RBC 4.02 01/18/2015 1433   HGB 12.4 01/18/2015 1433   HCT 37.3 01/18/2015 1433   PLT 185 01/18/2015 1433   MCV 92.8 01/18/2015 1433   MCH 30.8 01/18/2015 1433   MCHC 33.2 01/18/2015 1433   RDW 14.7 01/18/2015 1433   LYMPHSABS 3.2 01/18/2015 1433   MONOABS 0.2 01/18/2015 1433   EOSABS 0.1  01/18/2015 1433   BASOSABS 0.0 01/18/2015 1433   02/28/2016 Amylase 120 02/28/2016 Lipase 129.   02/26/2016 MRI Abdomen: IMPRESSION: No pancreatic mass or inflammatory changes is seen on MRI.  Layering gallbladder sludge. No intrahepatic or extrahepatic ductal dilatation. Common duct measures 4 mm.   She has a negative PPD 10/20/2014   02/26/2010 Colonoscopy: Finding: Erosive exudative, friable distal and mid rectum and multiple biopsies taken to rule out Crohn's disease or dysplasia.. The priximal rectum sigmoid, descending,transverse, ascending colon and cecum with no abnormalities. Cecum visualized. Biopsy: Acute and chronic inflammation compatible with inflammatory bowel disease of rectum.  12/27/2010 Flexible Sigmoidoscopy: Continued UC. 2007 Rectal biopsy: Acute and chronic inflammation compatible with Inflammatory Bowel Disease of rectum.   Review of Systems Past Medical History:  Diagnosis Date  . High cholesterol   . 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 PARTIAL HYSTERECTOMY     fibroids    Allergies  Allergen Reactions  . Doxycycline Itching    Patient was being treated for a Tick Bite.  Marland Kitchen. Penicillins     swelling    Current Outpatient Prescriptions on File Prior to Visit  Medication Sig Dispense Refill  . CANASA 1000 MG suppository PLACE 1 SUPPOSITORY (1,000 MG TOTAL) RECTALLY AT BEDTIME. 30 suppository 1  . fluticasone (FLONASE)  50 MCG/ACT nasal spray Place 1 spray into both nostrils daily.    . Lactobacillus (PROBIOTIC ACIDOPHILUS PO) Take 500 mg by mouth daily.     . Multiple Vitamins-Minerals (CENTRUM PO) Take by mouth daily. This is liquid form.    . rosuvastatin (CRESTOR) 10 MG tablet Take 10 mg by mouth daily.     No current facility-administered medications on file prior to visit.        Objective:   Physical Exam Blood pressure (!) 90/54, pulse 64, temperature 98.2 F (36.8 C),  height 5\' 2"  (1.575 m), weight 134 lb 12.8 oz (61.1 kg).  Alert and oriented. Skin warm and dry. Oral mucosa is moist.   . Sclera anicteric, conjunctivae is pink. Thyroid not enlarged. No cervical lymphadenopathy. Lungs clear. Heart regular rate and rhythm.  Abdomen is soft. Bowel sounds are positive. No hepatomegaly. No abdominal masses felt. No  abdominal tenderness.  No edema to lower extremities.         Assessment & Plan:  Upper abdominal pain. Amylase, CMET, CBC. Will discuss with Dr. Karilyn Cota.  UC: Continue the Canasa.  Clear to bland diet.

## 2016-03-07 LAB — COMPREHENSIVE METABOLIC PANEL
ALT: 40 U/L — ABNORMAL HIGH (ref 6–29)
AST: 53 U/L — ABNORMAL HIGH (ref 10–35)
Albumin: 3.7 g/dL (ref 3.6–5.1)
Alkaline Phosphatase: 102 U/L (ref 33–130)
BUN: 6 mg/dL — ABNORMAL LOW (ref 7–25)
CALCIUM: 8.7 mg/dL (ref 8.6–10.4)
CO2: 30 mmol/L (ref 20–31)
Chloride: 104 mmol/L (ref 98–110)
Creat: 0.71 mg/dL (ref 0.50–0.99)
GLUCOSE: 99 mg/dL (ref 65–99)
POTASSIUM: 3.4 mmol/L — AB (ref 3.5–5.3)
Sodium: 143 mmol/L (ref 135–146)
Total Bilirubin: 0.3 mg/dL (ref 0.2–1.2)
Total Protein: 6.5 g/dL (ref 6.1–8.1)

## 2016-03-07 LAB — CBC WITH DIFFERENTIAL/PLATELET
BASOS PCT: 0 %
Basophils Absolute: 0 cells/uL (ref 0–200)
EOS ABS: 168 {cells}/uL (ref 15–500)
Eosinophils Relative: 3 %
HEMATOCRIT: 33.7 % — AB (ref 35.0–45.0)
Hemoglobin: 11.5 g/dL — ABNORMAL LOW (ref 11.7–15.5)
LYMPHS PCT: 33 %
Lymphs Abs: 1848 cells/uL (ref 850–3900)
MCH: 30.9 pg (ref 27.0–33.0)
MCHC: 34.1 g/dL (ref 32.0–36.0)
MCV: 90.6 fL (ref 80.0–100.0)
MONO ABS: 448 {cells}/uL (ref 200–950)
MPV: 8.8 fL (ref 7.5–12.5)
Monocytes Relative: 8 %
NEUTROS PCT: 56 %
Neutro Abs: 3136 cells/uL (ref 1500–7800)
PLATELETS: 230 10*3/uL (ref 140–400)
RBC: 3.72 MIL/uL — ABNORMAL LOW (ref 3.80–5.10)
RDW: 14.9 % (ref 11.0–15.0)
WBC: 5.6 10*3/uL (ref 3.8–10.8)

## 2016-03-07 LAB — AMYLASE: Amylase: 188 U/L — ABNORMAL HIGH (ref 0–105)

## 2016-03-08 ENCOUNTER — Other Ambulatory Visit (INDEPENDENT_AMBULATORY_CARE_PROVIDER_SITE_OTHER): Payer: Self-pay | Admitting: *Deleted

## 2016-03-08 DIAGNOSIS — R109 Unspecified abdominal pain: Secondary | ICD-10-CM

## 2016-03-08 DIAGNOSIS — R1013 Epigastric pain: Secondary | ICD-10-CM

## 2016-03-08 DIAGNOSIS — G8929 Other chronic pain: Secondary | ICD-10-CM

## 2016-03-08 LAB — GASTROINTESTINAL PATHOGEN PANEL PCR
C. difficile Tox A/B, PCR: NOT DETECTED
Campylobacter, PCR: NOT DETECTED
Cryptosporidium, PCR: NOT DETECTED
E COLI (ETEC) LT/ST, PCR: NOT DETECTED
E COLI 0157, PCR: NOT DETECTED
E coli (STEC) stx1/stx2, PCR: NOT DETECTED
Giardia lamblia, PCR: NOT DETECTED
Norovirus, PCR: NOT DETECTED
Rotavirus A, PCR: NOT DETECTED
SALMONELLA, PCR: NOT DETECTED
SHIGELLA, PCR: NOT DETECTED

## 2016-03-11 ENCOUNTER — Encounter (INDEPENDENT_AMBULATORY_CARE_PROVIDER_SITE_OTHER): Payer: Self-pay | Admitting: *Deleted

## 2016-03-11 ENCOUNTER — Other Ambulatory Visit (INDEPENDENT_AMBULATORY_CARE_PROVIDER_SITE_OTHER): Payer: Self-pay | Admitting: *Deleted

## 2016-03-11 DIAGNOSIS — R109 Unspecified abdominal pain: Secondary | ICD-10-CM

## 2016-03-11 DIAGNOSIS — R1013 Epigastric pain: Secondary | ICD-10-CM

## 2016-03-11 DIAGNOSIS — G8929 Other chronic pain: Secondary | ICD-10-CM

## 2016-03-22 LAB — AMYLASE: AMYLASE: 166 U/L — AB (ref 0–105)

## 2016-03-28 ENCOUNTER — Other Ambulatory Visit (INDEPENDENT_AMBULATORY_CARE_PROVIDER_SITE_OTHER): Payer: Self-pay | Admitting: *Deleted

## 2016-03-28 DIAGNOSIS — K51819 Other ulcerative colitis with unspecified complications: Secondary | ICD-10-CM

## 2016-04-11 ENCOUNTER — Other Ambulatory Visit (INDEPENDENT_AMBULATORY_CARE_PROVIDER_SITE_OTHER): Payer: Self-pay | Admitting: *Deleted

## 2016-04-11 ENCOUNTER — Encounter (INDEPENDENT_AMBULATORY_CARE_PROVIDER_SITE_OTHER): Payer: Self-pay | Admitting: *Deleted

## 2016-04-11 DIAGNOSIS — K51819 Other ulcerative colitis with unspecified complications: Secondary | ICD-10-CM

## 2016-05-06 ENCOUNTER — Ambulatory Visit (INDEPENDENT_AMBULATORY_CARE_PROVIDER_SITE_OTHER): Payer: BLUE CROSS/BLUE SHIELD | Admitting: Internal Medicine

## 2016-05-31 ENCOUNTER — Other Ambulatory Visit (INDEPENDENT_AMBULATORY_CARE_PROVIDER_SITE_OTHER): Payer: Self-pay | Admitting: Internal Medicine

## 2016-08-21 ENCOUNTER — Other Ambulatory Visit (INDEPENDENT_AMBULATORY_CARE_PROVIDER_SITE_OTHER): Payer: Self-pay | Admitting: Internal Medicine

## 2016-09-03 ENCOUNTER — Ambulatory Visit (INDEPENDENT_AMBULATORY_CARE_PROVIDER_SITE_OTHER): Payer: BLUE CROSS/BLUE SHIELD | Admitting: Internal Medicine

## 2016-09-04 ENCOUNTER — Encounter (INDEPENDENT_AMBULATORY_CARE_PROVIDER_SITE_OTHER): Payer: Self-pay | Admitting: Internal Medicine

## 2016-09-04 ENCOUNTER — Ambulatory Visit (INDEPENDENT_AMBULATORY_CARE_PROVIDER_SITE_OTHER): Payer: BLUE CROSS/BLUE SHIELD | Admitting: Internal Medicine

## 2016-09-04 ENCOUNTER — Encounter (INDEPENDENT_AMBULATORY_CARE_PROVIDER_SITE_OTHER): Payer: Self-pay

## 2016-09-04 VITALS — BP 100/52 | HR 72 | Temp 97.5°F | Ht 62.0 in | Wt 138.6 lb

## 2016-09-04 DIAGNOSIS — K5120 Ulcerative (chronic) proctitis without complications: Secondary | ICD-10-CM

## 2016-09-04 LAB — CBC WITH DIFFERENTIAL/PLATELET
BASOS ABS: 0 {cells}/uL (ref 0–200)
Basophils Relative: 0 %
EOS ABS: 102 {cells}/uL (ref 15–500)
Eosinophils Relative: 2 %
HEMATOCRIT: 38 % (ref 35.0–45.0)
HEMOGLOBIN: 12.4 g/dL (ref 11.7–15.5)
LYMPHS ABS: 3060 {cells}/uL (ref 850–3900)
LYMPHS PCT: 60 %
MCH: 30.4 pg (ref 27.0–33.0)
MCHC: 32.6 g/dL (ref 32.0–36.0)
MCV: 93.1 fL (ref 80.0–100.0)
MONO ABS: 204 {cells}/uL (ref 200–950)
MPV: 9.2 fL (ref 7.5–12.5)
Monocytes Relative: 4 %
NEUTROS PCT: 34 %
Neutro Abs: 1734 cells/uL (ref 1500–7800)
Platelets: 230 10*3/uL (ref 140–400)
RBC: 4.08 MIL/uL (ref 3.80–5.10)
RDW: 14.7 % (ref 11.0–15.0)
WBC: 5.1 10*3/uL (ref 3.8–10.8)

## 2016-09-04 NOTE — Progress Notes (Signed)
   Subjective:    Patient ID: Sydney PearsonCharlotte Hughes, female    DOB: 1956-03-08, 61 y.o.   MRN: [id number]  HPI Here today for f/u. Hx of UC. She was last seen in September of 2017.  She tells me she is doing good. She has a BM usually everyday. No blood or mucous. No abdominal pain. Her appetite is good.  She has gained 4 pounds since her last visit. Presently taking Canasa daily but may skip x 1 doses.  Family hx significant for pancreatic cancer. No nausea or vomiting.    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   02/26/2016 MRI Abdomen: IMPRESSION: No pancreatic mass or inflammatory changes is seen on MRI. Layering gallbladder sludge. No intrahepatic or extrahepatic ductal dilatation. Common duct measures 4 mm.  Review of Systems Past Medical History:  Diagnosis Date  . High cholesterol   . 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 PARTIAL HYSTERECTOMY     fibroids    Allergies  Allergen Reactions  . Doxycycline Itching    Patient was being treated for a Tick Bite.  Marland Kitchen. Penicillins     swelling    Current Outpatient Prescriptions on File Prior to Visit  Medication Sig Dispense Refill  . CANASA 1000 MG suppository PLACE 1 SUPPOSITORY (1,000 MG TOTAL) RECTALLY AT BEDTIME. 30 suppository 1  . fluticasone (FLONASE) 50 MCG/ACT nasal spray Place 1 spray into both nostrils daily.    . Lactobacillus (PROBIOTIC ACIDOPHILUS PO) Take 500 mg by mouth daily.     . rosuvastatin (CRESTOR) 10 MG tablet Take 10 mg by mouth daily.     No current facility-administered medications on file prior to visit.        Objective:   Physical Exam Blood pressure (!) 100/52, pulse 72, temperature 97.5 F (36.4 C), height 5\' 2"  (1.575 m), weight 138 lb 9.6 oz (62.9 kg).  Alert and oriented. Skin warm and dry. Oral mucosa is moist.   . Sclera anicteric, conjunctivae is pink. Thyroid not enlarged.  No cervical lymphadenopathy. Lungs clear. Heart regular rate and rhythm.  Abdomen is soft. Bowel sounds are positive. No hepatomegaly. No abdominal masses felt. No tenderness.  No edema to lower extremities.        Assessment & Plan:  UC. She seems to be doing well. Am going to get a CBC and sedrate today. OV in 1 year. She will continue the Canasa.

## 2016-09-04 NOTE — Patient Instructions (Signed)
OV in 1 year.  

## 2016-09-05 LAB — SEDIMENTATION RATE: Sed Rate: 44 mm/hr — ABNORMAL HIGH (ref 0–30)

## 2016-09-11 ENCOUNTER — Other Ambulatory Visit (INDEPENDENT_AMBULATORY_CARE_PROVIDER_SITE_OTHER): Payer: Self-pay | Admitting: *Deleted

## 2016-09-11 ENCOUNTER — Encounter (INDEPENDENT_AMBULATORY_CARE_PROVIDER_SITE_OTHER): Payer: Self-pay | Admitting: *Deleted

## 2016-09-11 DIAGNOSIS — K51218 Ulcerative (chronic) proctitis with other complication: Secondary | ICD-10-CM

## 2016-10-10 LAB — SEDIMENTATION RATE: Sed Rate: 30 mm/hr (ref 0–30)

## 2016-10-14 ENCOUNTER — Telehealth (INDEPENDENT_AMBULATORY_CARE_PROVIDER_SITE_OTHER): Payer: Self-pay | Admitting: Internal Medicine

## 2016-10-14 NOTE — Telephone Encounter (Signed)
Results given to patient

## 2016-10-14 NOTE — Telephone Encounter (Signed)
Patient called, stated that she was returning a call from last week.  [phone]

## 2016-10-31 ENCOUNTER — Other Ambulatory Visit (INDEPENDENT_AMBULATORY_CARE_PROVIDER_SITE_OTHER): Payer: Self-pay | Admitting: Internal Medicine

## 2017-01-21 ENCOUNTER — Other Ambulatory Visit (INDEPENDENT_AMBULATORY_CARE_PROVIDER_SITE_OTHER): Payer: Self-pay | Admitting: Internal Medicine

## 2017-04-07 ENCOUNTER — Other Ambulatory Visit (INDEPENDENT_AMBULATORY_CARE_PROVIDER_SITE_OTHER): Payer: Self-pay | Admitting: Internal Medicine

## 2017-09-04 ENCOUNTER — Ambulatory Visit (INDEPENDENT_AMBULATORY_CARE_PROVIDER_SITE_OTHER): Payer: BLUE CROSS/BLUE SHIELD | Admitting: Internal Medicine

## 2017-09-04 ENCOUNTER — Encounter (INDEPENDENT_AMBULATORY_CARE_PROVIDER_SITE_OTHER): Payer: Self-pay | Admitting: Internal Medicine

## 2017-09-04 VITALS — BP 108/70 | HR 76 | Temp 97.5°F | Ht 62.0 in | Wt 136.1 lb

## 2017-09-04 DIAGNOSIS — K5120 Ulcerative (chronic) proctitis without complications: Secondary | ICD-10-CM | POA: Diagnosis not present

## 2017-09-04 NOTE — Patient Instructions (Signed)
OV in 1 year.  

## 2017-09-04 NOTE — Progress Notes (Signed)
   Subjective:    Patient ID: Sydney PearsonCharlotte Hughes, female    DOB: 1956/05/22, 62 y.o.   MRN: [id number]  HPI Here today for f/u. Last seen one year. Hx of UC.  Her CBC and sedrate were normal. She tells me she is doing good. She uses Canasasa supp at hs. Her appetite is good. She has lost about 2 pounds since her last visit. She has a BM every day or every other day. No melena or BRRB.  She is do for surveillance colonoscopy.      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  Family hx of pancreatic cancer (brother age 64)   02/26/2016 MRI Abdomen: IMPRESSION: No pancreatic mass or inflammatory changes is seen on MRI. Layering gallbladder sludge. No intrahepatic or extrahepatic ductal dilatation. Common duct measures 4 mm.  CBC    Component Value Date/Time   WBC 5.1 09/04/2016 1458   RBC 4.08 09/04/2016 1458   HGB 12.4 09/04/2016 1458   HCT 38.0 09/04/2016 1458   PLT 230 09/04/2016 1458   MCV 93.1 09/04/2016 1458   MCH 30.4 09/04/2016 1458   MCHC 32.6 09/04/2016 1458   RDW 14.7 09/04/2016 1458   LYMPHSABS 3,060 09/04/2016 1458   MONOABS 204 09/04/2016 1458   EOSABS 102 09/04/2016 1458   BASOSABS 0 09/04/2016 1458   Erythrocyte Sedimentation Rate     Component Value Date/Time   ESRSEDRATE 30 10/09/2016 0734     Review of Systems Past Medical History:  Diagnosis Date  . High cholesterol   . 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 PARTIAL HYSTERECTOMY     fibroids    Allergies  Allergen Reactions  . Doxycycline Itching    Patient was being treated for a Tick Bite.  Marland Kitchen. Penicillins     swelling    Current Outpatient Medications on File Prior to Visit  Medication Sig Dispense Refill  . CANASA 1000 MG suppository UNWRAP AND INSERT 1 SUPPOSITORY RECTALLY AT BEDTIME 30 suppository 1  . Lactobacillus (PROBIOTIC ACIDOPHILUS PO) Take 500 mg by mouth daily.       . rosuvastatin (CRESTOR) 10 MG tablet Take 10 mg by mouth daily.     No current facility-administered medications on file prior to visit.         Objective:   Physical Exam Blood pressure 108/70, pulse 76, temperature (!) 97.5 F (36.4 C), height 5\' 2"  (1.575 m), weight 136 lb 1.6 oz (61.7 kg). Alert and oriented. Skin warm and dry. Oral mucosa is moist.   . Sclera anicteric, conjunctivae is pink. Thyroid not enlarged. No cervical lymphadenopathy. Lungs clear. Heart regular rate and rhythm.  Abdomen is soft. Bowel sounds are positive. No hepatomegaly. No abdominal masses felt. No tenderness.  No edema to lower extremities.           Assessment & Plan:  UC . She seems to be doing good. Will get a CBC and CRP. OV in 1 year. She will me know when she is ready for the colonoscopy.

## 2017-09-05 LAB — CBC WITH DIFFERENTIAL/PLATELET
Basophils Absolute: 39 cells/uL (ref 0–200)
Basophils Relative: 0.7 %
EOS PCT: 0.7 %
Eosinophils Absolute: 39 cells/uL (ref 15–500)
HCT: 36.8 % (ref 35.0–45.0)
Hemoglobin: 12.5 g/dL (ref 11.7–15.5)
Lymphs Abs: 3058 cells/uL (ref 850–3900)
MCH: 30.4 pg (ref 27.0–33.0)
MCHC: 34 g/dL (ref 32.0–36.0)
MCV: 89.5 fL (ref 80.0–100.0)
MONOS PCT: 4 %
MPV: 9.6 fL (ref 7.5–12.5)
NEUTROS PCT: 39 %
Neutro Abs: 2145 cells/uL (ref 1500–7800)
PLATELETS: 201 10*3/uL (ref 140–400)
RBC: 4.11 10*6/uL (ref 3.80–5.10)
RDW: 13.1 % (ref 11.0–15.0)
Total Lymphocyte: 55.6 %
WBC mixed population: 220 cells/uL (ref 200–950)
WBC: 5.5 10*3/uL (ref 3.8–10.8)

## 2017-09-05 LAB — C-REACTIVE PROTEIN: CRP: 4.5 mg/L (ref <8.0)

## 2018-01-17 IMAGING — MR MR ABDOMEN WO/W CM MRCP
7 of 21 series · 12 of 48 positions shown · IV contrast (multihance)
Comparison: None.

CLINICAL DATA: Upper abdominal pain, nausea, ulcerative colitis

EXAM:
MRI ABDOMEN WITHOUT AND WITH CONTRAST (INCLUDING MRCP)
TECHNIQUE: Multiplanar multisequence MR imaging of the abdomen was performed
both before and after the administration of intravenous contrast.
Heavily T2-weighted images of the biliary and pancreatic ducts were
obtained, and three-dimensional MRCP images were rendered by post
processing.
CONTRAST:  13mL MULTIHANCE GADOBENATE DIMEGLUMINE 529 MG/ML IV SOLN

[Series 4: T2 · coronal · 5.0mm · 0.97mm/px · 1 of 30 slices shown (1 of 2)]
[im 1/30]
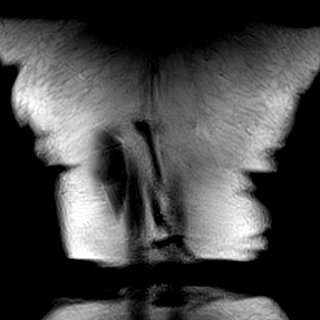

[Series 6: t2fs axial · axial · 5.0mm · 0.95mm/px · 1 of 40 slices shown]
[im 1/40]
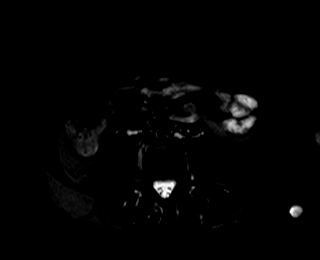

[Series 7: bSSFP · axial · 4.0mm · 0.59mm/px · 1 of 70 slices shown]
[im 1/70]
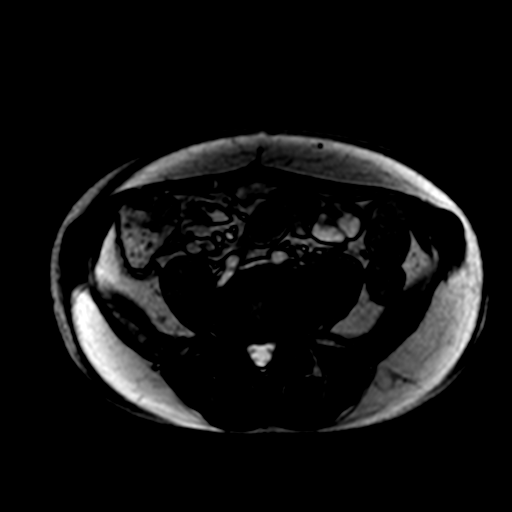

[Series 8: T1 · axial · 4.0mm · 0.51mm/px · 1 of 64 slices shown (1 of 3)]
[im 1/64]
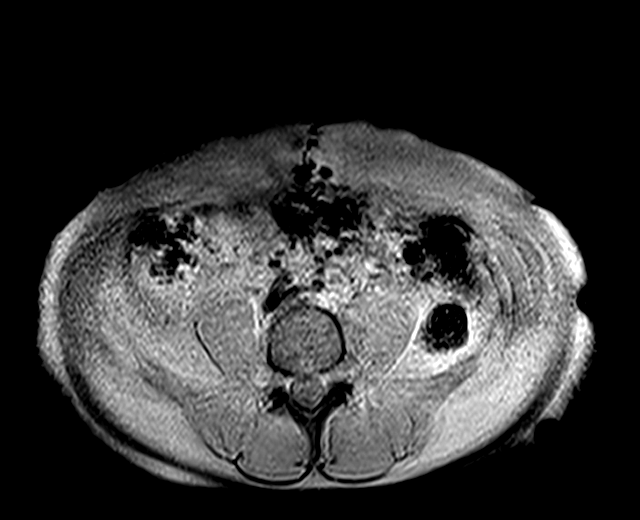

[Series 9: T1 · axial · 4.0mm · 0.51mm/px · z∈[-64,+188]mm · 2 of 64 slices shown (2 of 3)]
[im 1/64]
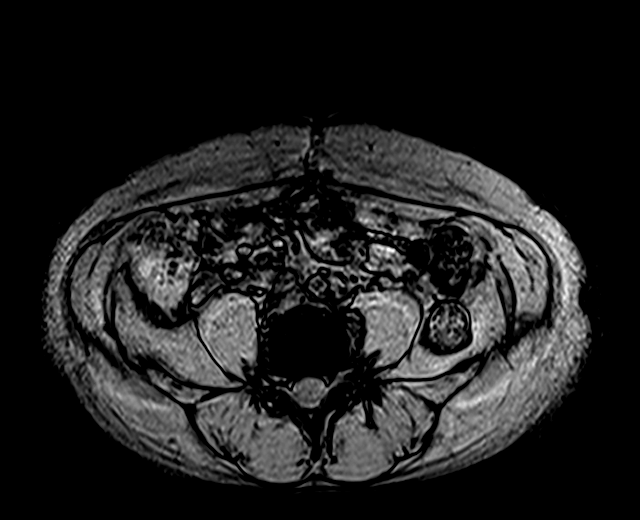
[im 64/64]
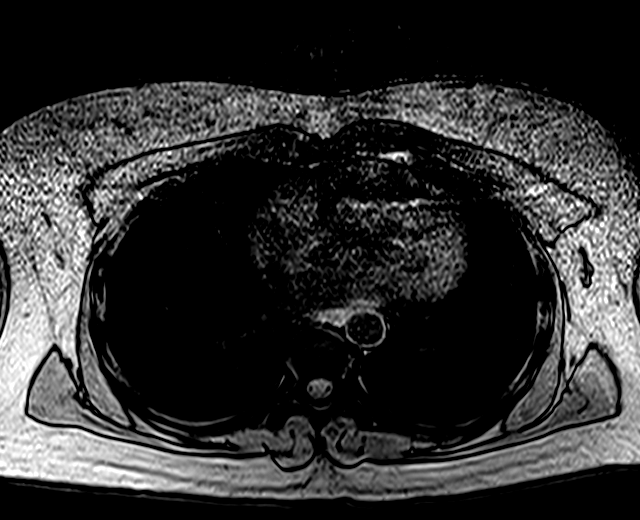

[Series 11: T1 · axial · 4.0mm · 0.51mm/px · z∈[-64,+188]mm · 2 of 64 slices shown (3 of 3)]
[im 1/64]
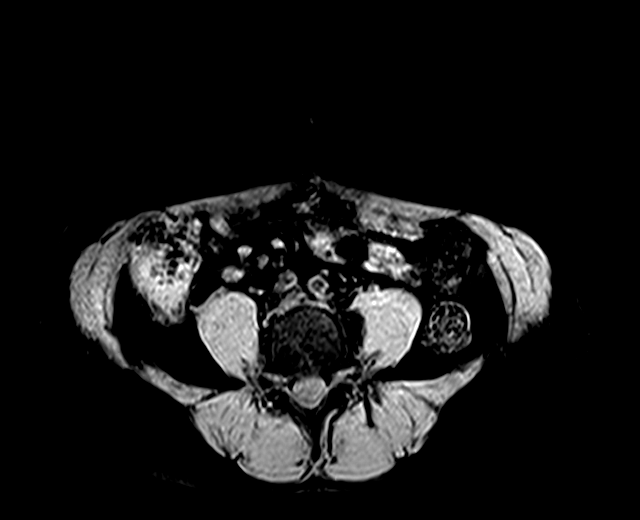
[im 64/64]
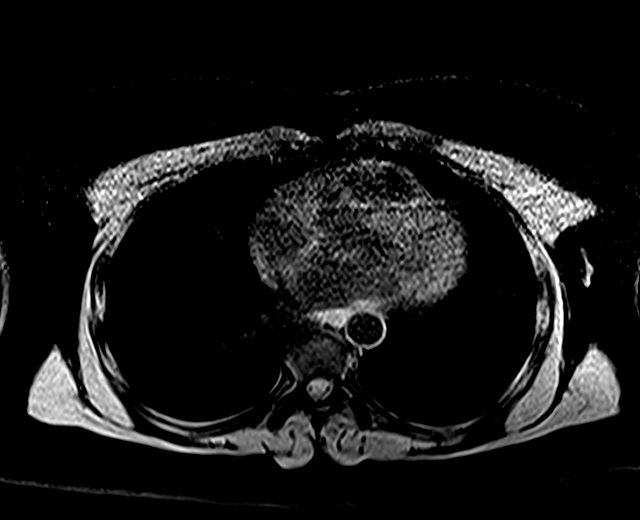

[Series 12: T2 · coronal · 1.0mm · 0.42mm/px · 4 of 117 slices shown (2 of 2)]
[im 1/117]
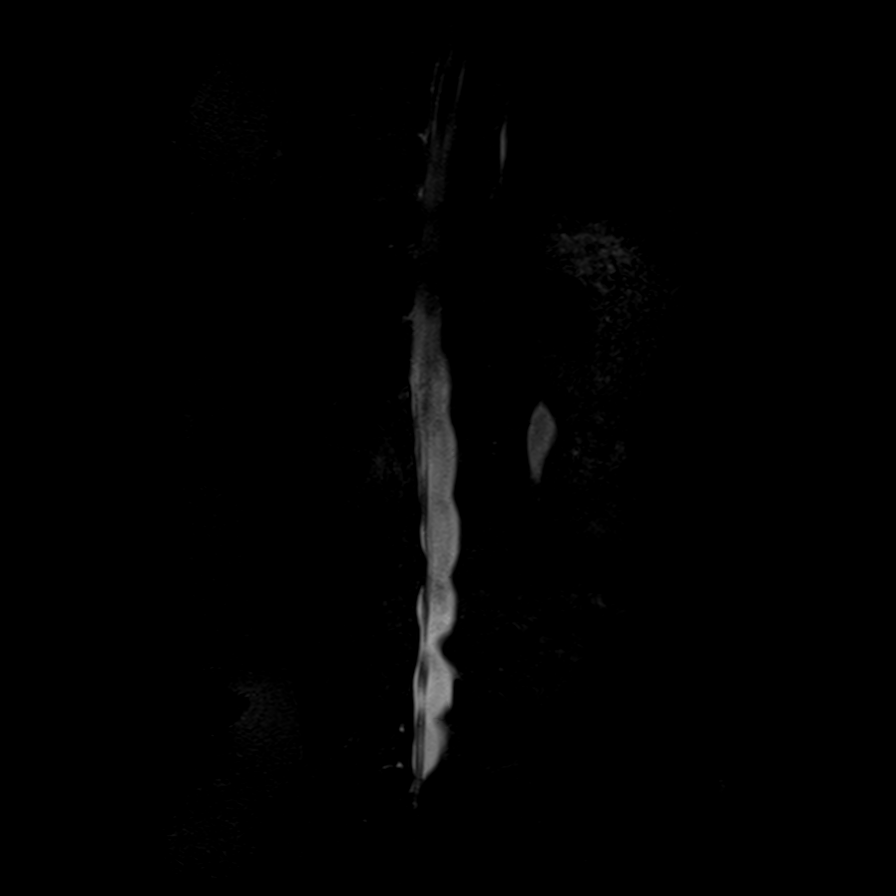
[im 39/117]
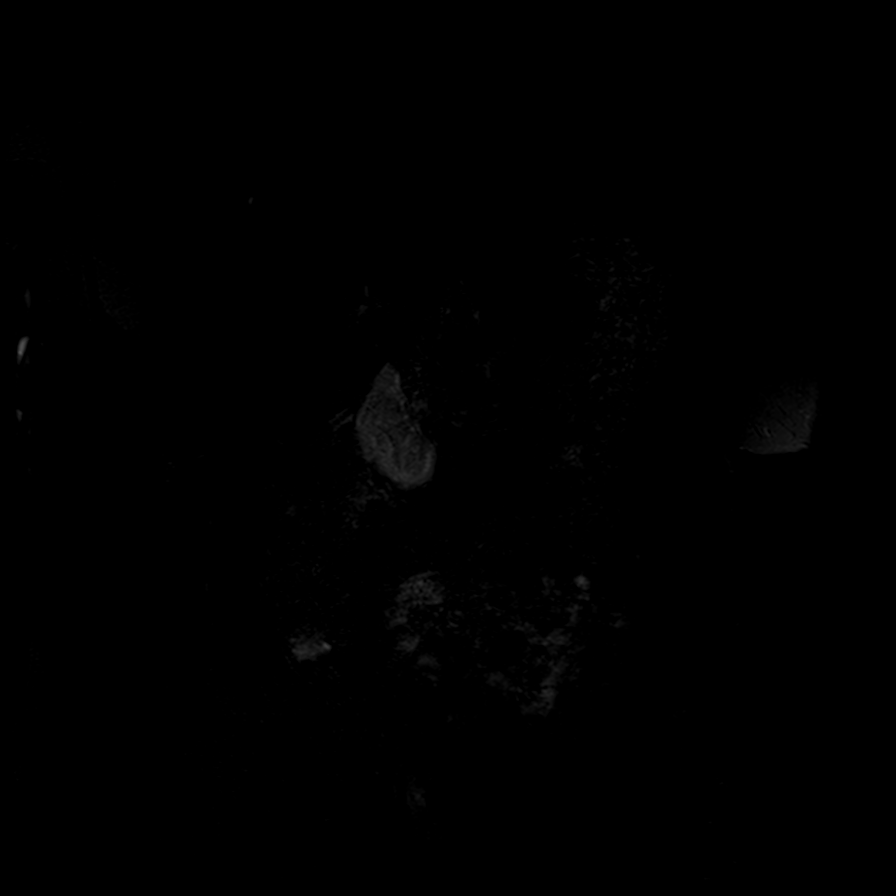
[im 78/117]
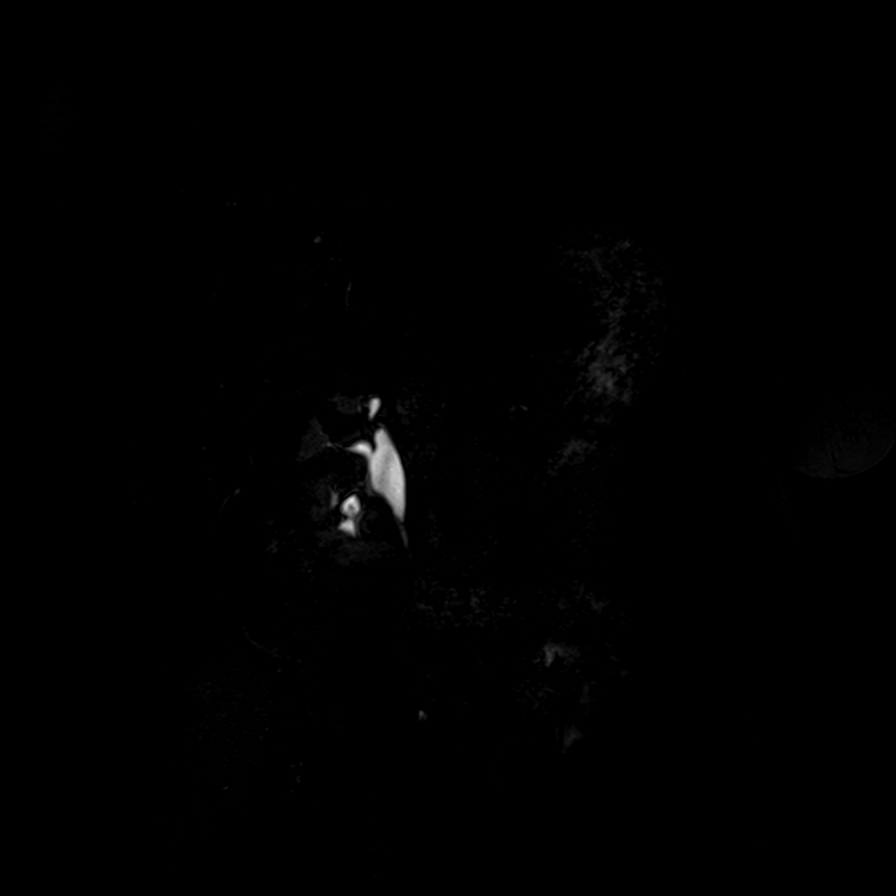
[im 117/117]
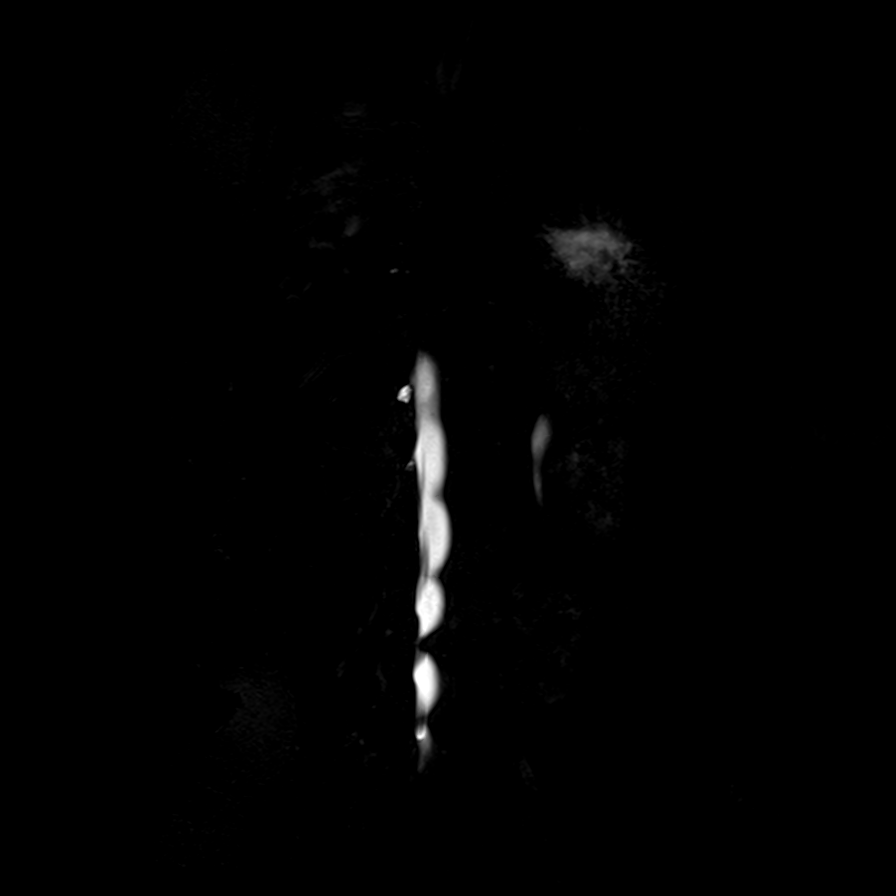

[12 of 48 positions shown; findings below may reference images not displayed]

FINDINGS: Lower chest: Lung bases are clear.

Hepatobiliary: Liver is within normal limits. No suspicious
enhancing hepatic lesions. No hepatic steatosis.

Layering gallbladder sludge. No intrahepatic or extrahepatic ductal
dilatation. Common duct measures 4 mm.

Pancreas: Within normal limits. No pancreatic mass, atrophy, or
ductal dilatation. No peripancreatic inflammatory changes or fluid
collection.

Spleen:  Within normal limits.

Adrenals/Urinary Tract:  Adrenal glands are within normal limits.

4 mm left upper pole renal cyst. Right kidney is within normal
limits. No hydronephrosis.

Stomach/Bowel: Stomach is within normal limits.

Visualized bowel is unremarkable.

Vascular/Lymphatic:  No evidence of abdominal aortic aneurysm.

No suspicious abdominal lymphadenopathy.

Other:  No abdominal ascites.

Musculoskeletal: No focal osseous lesions.
IMPRESSION: No pancreatic mass or inflammatory changes is seen on MRI.

Layering gallbladder sludge. No intrahepatic or extrahepatic ductal
dilatation. Common duct measures 4 mm.

## 2018-09-08 ENCOUNTER — Ambulatory Visit (INDEPENDENT_AMBULATORY_CARE_PROVIDER_SITE_OTHER): Payer: BLUE CROSS/BLUE SHIELD | Admitting: Internal Medicine
